# Patient Record
Sex: Male | Born: 2000 | Race: White | Hispanic: No | Marital: Single | Smoking: Never smoker
Health system: Southern US, Community
[De-identification: ages and names within clinical notes are randomized; demographics above are authoritative.]

## PROBLEM LIST (undated history)

## (undated) DIAGNOSIS — T148XXA Other injury of unspecified body region, initial encounter: Secondary | ICD-10-CM

## (undated) DIAGNOSIS — I880 Nonspecific mesenteric lymphadenitis: Secondary | ICD-10-CM

## (undated) DIAGNOSIS — F988 Other specified behavioral and emotional disorders with onset usually occurring in childhood and adolescence: Secondary | ICD-10-CM

## (undated) DIAGNOSIS — F845 Asperger's syndrome: Secondary | ICD-10-CM

## (undated) DIAGNOSIS — T7840XA Allergy, unspecified, initial encounter: Secondary | ICD-10-CM

## (undated) DIAGNOSIS — E669 Obesity, unspecified: Secondary | ICD-10-CM

## (undated) HISTORY — DX: Nonspecific mesenteric lymphadenitis: I88.0

## (undated) HISTORY — DX: Other specified behavioral and emotional disorders with onset usually occurring in childhood and adolescence: F98.8

## (undated) HISTORY — DX: Asperger's syndrome: F84.5

## (undated) HISTORY — DX: Obesity, unspecified: E66.9

## (undated) HISTORY — DX: Other injury of unspecified body region, initial encounter: T14.8XXA

## (undated) HISTORY — DX: Allergy, unspecified, initial encounter: T78.40XA

---
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
## 2018-03-08 ENCOUNTER — Telehealth: Payer: Self-pay | Admitting: Family Medicine

## 2018-03-08 DIAGNOSIS — F988 Other specified behavioral and emotional disorders with onset usually occurring in childhood and adolescence: Secondary | ICD-10-CM

## 2018-03-08 NOTE — Telephone Encounter (Signed)
Copied from CRM [id number]. Topic: Appointment Scheduling - Scheduling Inquiry for Clinic >> Mar 03, 2018  9:53 AM Arlyss Gandyichardson, Taren N, NT wrote: Reason for CRM: Pts dad is calling to schedule son for a transfer care appt and med refill appt for next week. No available openings until January. Please advise if pt can be worked in and please call dad to schedule. >> Mar 03, 2018 11:08 AM Tanja Portockman, Caitlin P wrote: We cannot over book or use a same day slot for transfer of care. Patient will have to make appt on next opening day and be added to wait list in case someone cancels >> Mar 07, 2018 10:29 AM Fanny BienIlderton, Jessica L wrote: Pt father called and stated that he is upset with practice. Pt father states that it is not the patients fault that the provider left. Pt father states that sun will run out of medication in 11 days. Please call patient father for patient recovery

## 2018-03-08 NOTE — Telephone Encounter (Signed)
Can we refill patients medication until he can get in on his apt 03/30/18?

## 2018-03-10 MED ORDER — LISDEXAMFETAMINE DIMESYLATE 50 MG PO CAPS: 50.0000 mg | ORAL_CAPSULE | Freq: Every day | ORAL | 0 refills | Status: AC

## 2018-03-10 NOTE — Telephone Encounter (Signed)
Med refilled for 2 months Next appt Apr 17 2017 with new provider pmp reviewed

## 2018-03-10 NOTE — Telephone Encounter (Signed)
Please let father know that his son's medication was refilled and that his request to refill his son's medication until his upcoming appt with a new provider is very reasonable and we are sorry for any distress caused. thanks

## 2018-03-13 ENCOUNTER — Telehealth: Payer: Self-pay | Admitting: Family Medicine

## 2018-03-13 NOTE — Telephone Encounter (Signed)
Patient was denied his medication Vyvanse due to not meeting the criteria of his plan. Denial letter placed in the providers box at nurses station.

## 2018-03-15 NOTE — Telephone Encounter (Signed)
Patient will be seeing Dr Alvy BimlerSagardia early January. We do not have enough information to support a prior auth. thanks

## 2018-03-30 ENCOUNTER — Encounter

## 2018-03-30 ENCOUNTER — Ambulatory Visit: Payer: BC Managed Care – PPO | Admitting: Emergency Medicine

## 2019-10-08 ENCOUNTER — Other Ambulatory Visit: Payer: Self-pay | Admitting: Family Medicine

## 2019-10-08 DIAGNOSIS — R7989 Other specified abnormal findings of blood chemistry: Secondary | ICD-10-CM

## 2019-10-17 ENCOUNTER — Ambulatory Visit
Admission: RE | Admit: 2019-10-17 | Discharge: 2019-10-17 | Disposition: A | Discharge status: Home / Self Care | Payer: BC Managed Care – PPO | Source: Ambulatory Visit | Attending: Family Medicine | Admitting: Family Medicine

## 2019-10-17 DIAGNOSIS — R7989 Other specified abnormal findings of blood chemistry: Secondary | ICD-10-CM

## 2021-04-27 IMAGING — US US ABDOMEN COMPLETE
1 series · 14 of 25 positions shown · non-contrast
Comparison: CT 01/07/2011

CLINICAL DATA: Elevated LFT

EXAM:
ABDOMEN ULTRASOUND COMPLETE

[Series 1: us abdomen complete · 0.24mm/px · 14 of 85 slices shown]
[im 1/85]
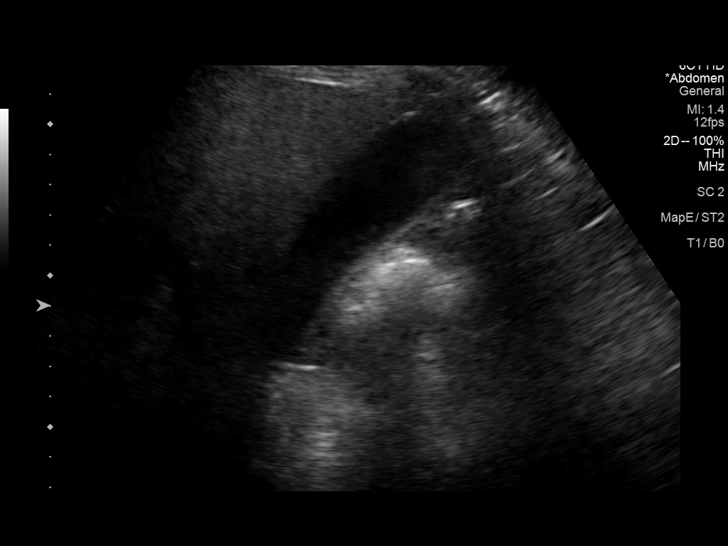
[im 8/85]
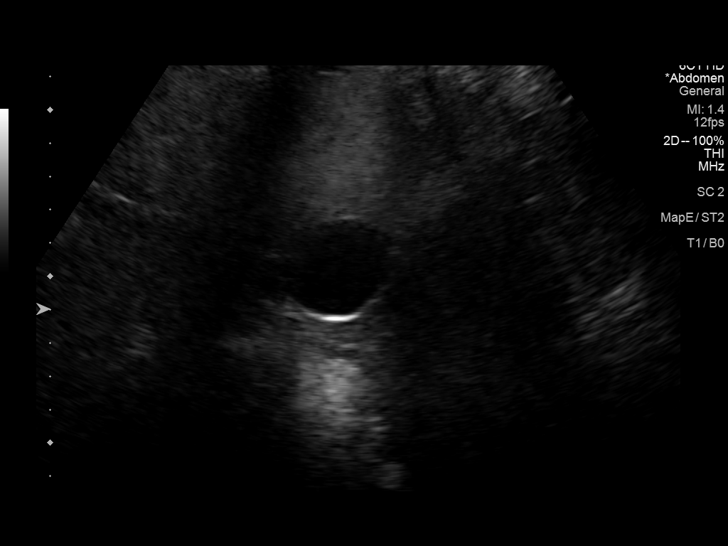
[im 15/85]
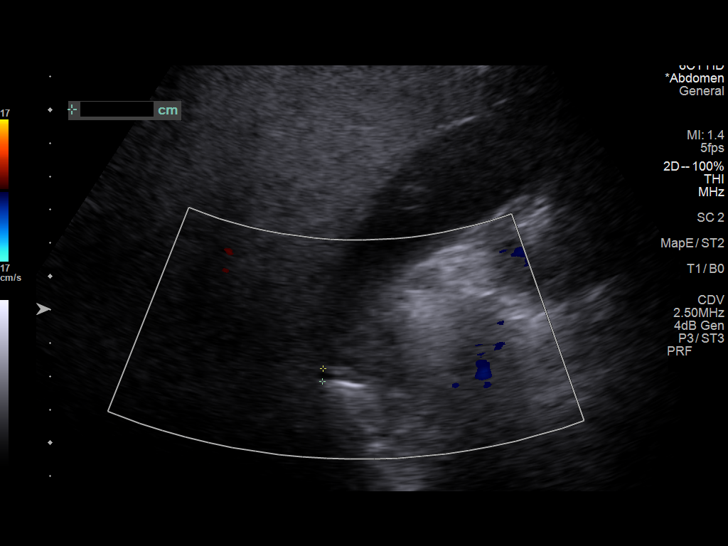
[im 22/85]
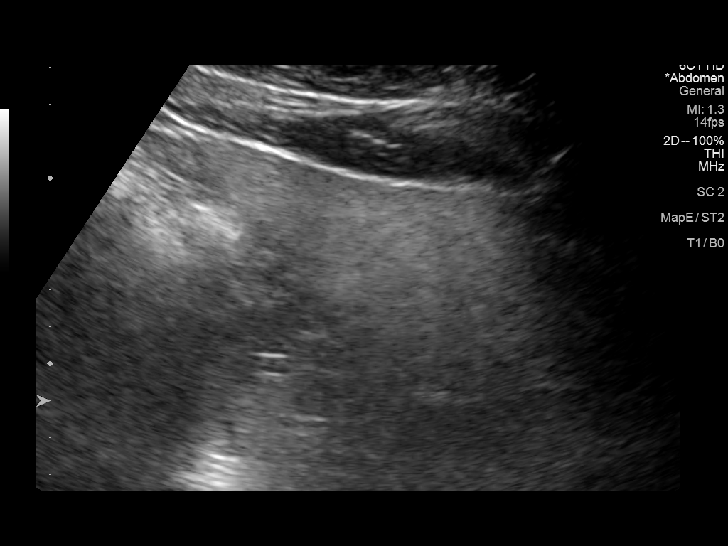
[im 29/85]
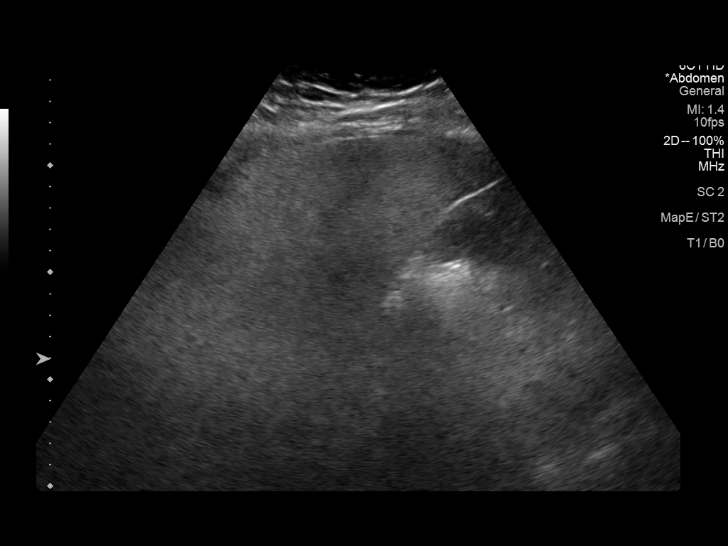
[im 32/85]
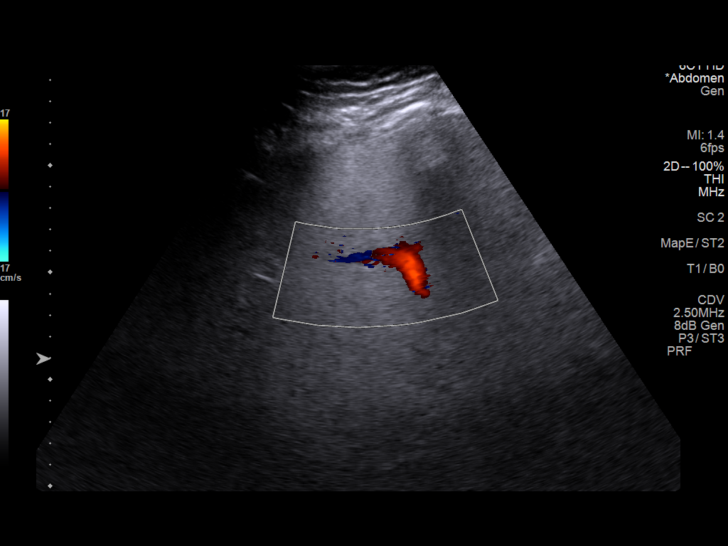
[im 39/85]
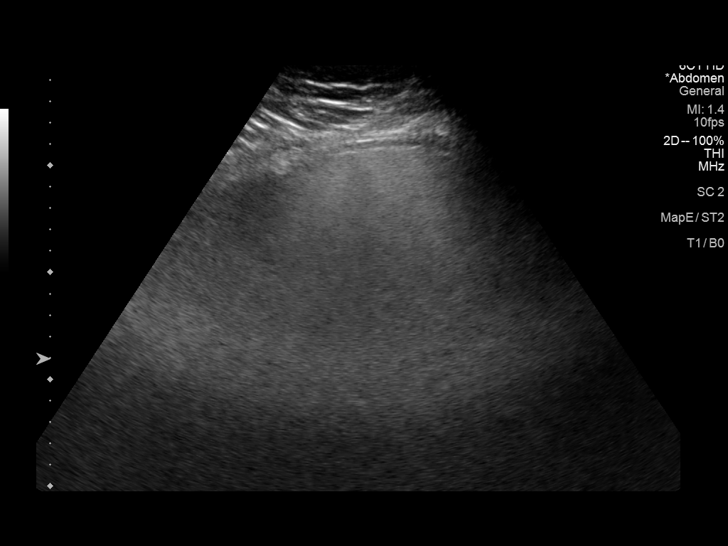
[im 46/85]
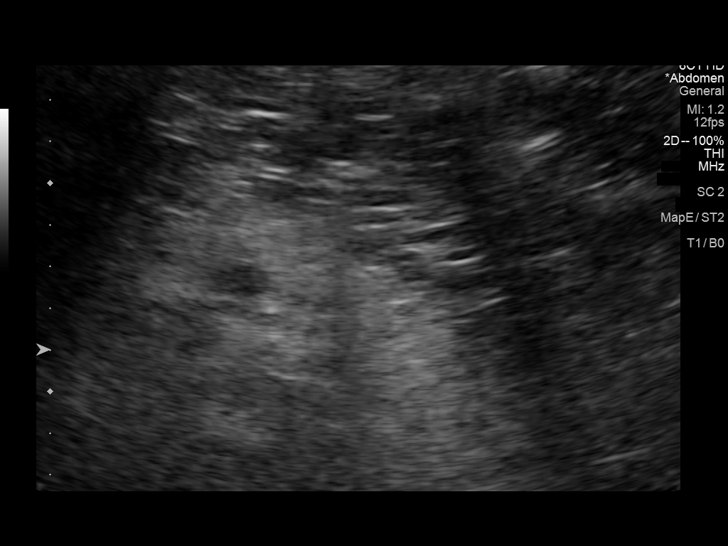
[im 53/85]
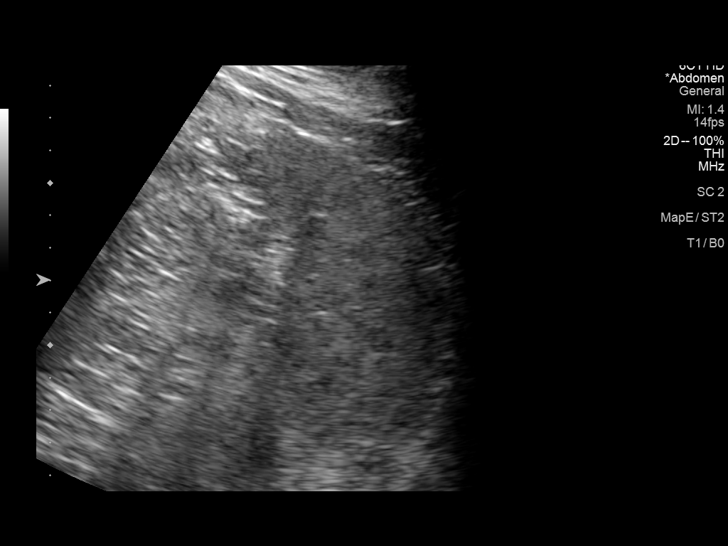
[im 57/85]
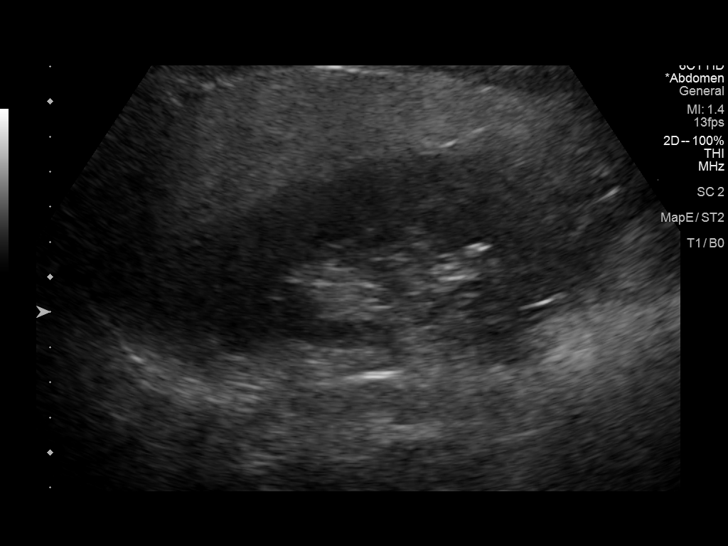
[im 64/85]
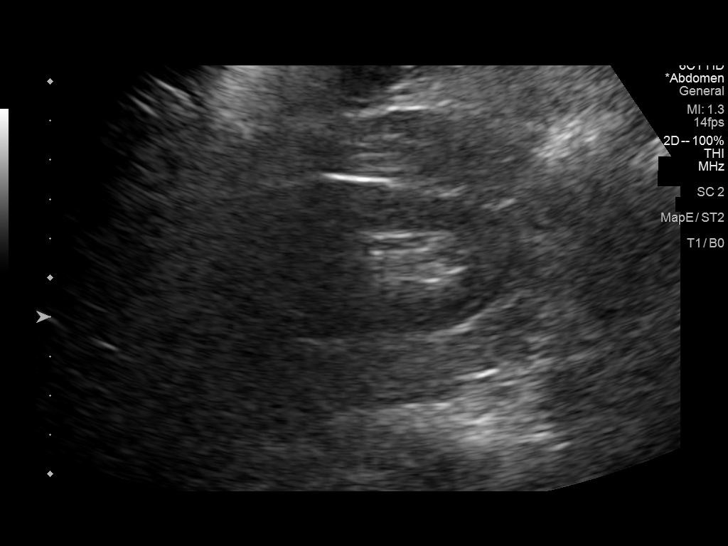
[im 71/85]
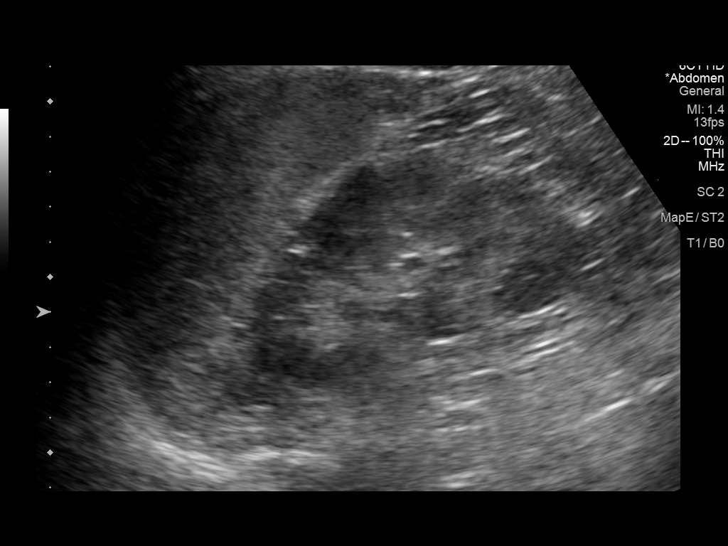
[im 78/85]
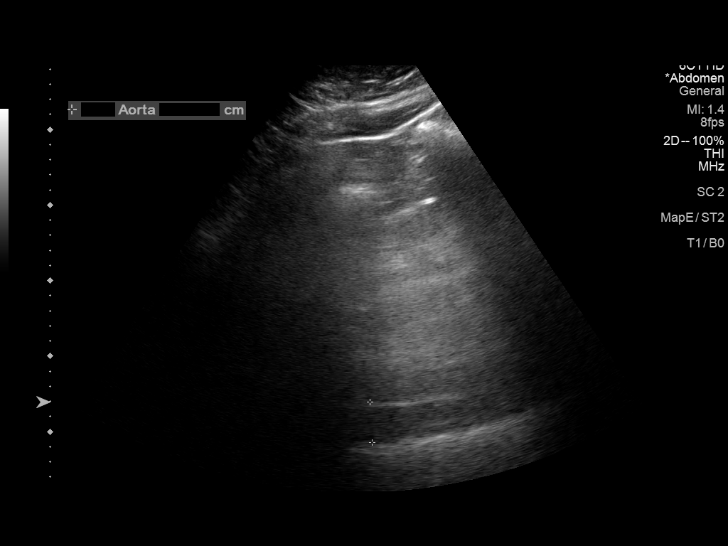
[im 85/85]
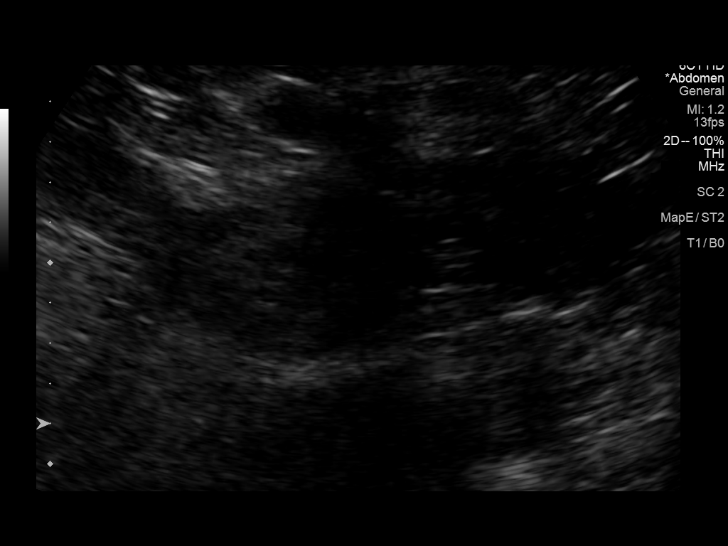

[14 of 25 positions shown; findings below may reference images not displayed]

FINDINGS: Gallbladder: No gallstones or wall thickening visualized. No
sonographic Murphy sign noted by sonographer.

Common bile duct: Diameter: 3.8 mm

Liver: Liver is diffusely echogenic. Slightly limited evaluation but
no obvious focal abnormality. Portal vein is patent on color Doppler
imaging with normal direction of blood flow towards the liver.

IVC: No abnormality visualized.

Pancreas: Visualized portion unremarkable.

Spleen: Size and appearance within normal limits.

Right Kidney: Length: 11.1 cm. Echogenicity within normal limits. No
mass or hydronephrosis visualized.

Left Kidney: Length: 11.1 cm. Echogenicity within normal limits. No
mass or hydronephrosis visualized.

Abdominal aorta: No aneurysm visualized.

Other findings: None.
IMPRESSION: 1. Echogenic liver consistent with hepatic steatosis.
2. Otherwise negative examination

## 2024-05-24 ENCOUNTER — Ambulatory Visit: Payer: Self-pay | Admitting: Nurse Practitioner
